# Patient Record
Sex: Male | Born: 1960 | Hispanic: Yes | Marital: Single | Smoking: Former smoker
Health system: Southern US, Community
[De-identification: ages and names within clinical notes are randomized; demographics above are authoritative.]

## PROBLEM LIST (undated history)

## (undated) DIAGNOSIS — I10 Essential (primary) hypertension: Secondary | ICD-10-CM

## (undated) DIAGNOSIS — E119 Type 2 diabetes mellitus without complications: Secondary | ICD-10-CM

---

## 2005-08-13 ENCOUNTER — Emergency Department: Payer: Self-pay | Admitting: Internal Medicine

## 2010-10-09 ENCOUNTER — Emergency Department: Payer: Self-pay | Admitting: Emergency Medicine

## 2012-10-18 ENCOUNTER — Emergency Department: Payer: Self-pay | Admitting: Emergency Medicine

## 2013-09-13 ENCOUNTER — Emergency Department: Payer: Self-pay | Admitting: Internal Medicine

## 2013-09-17 ENCOUNTER — Emergency Department: Payer: Self-pay | Admitting: Emergency Medicine

## 2013-09-27 ENCOUNTER — Emergency Department: Payer: Self-pay | Admitting: Emergency Medicine

## 2014-07-21 ENCOUNTER — Emergency Department
Admission: EM | Admit: 2014-07-21 | Discharge: 2014-07-22 | Disposition: A | Discharge status: Home / Self Care | Payer: Self-pay

## 2016-10-17 ENCOUNTER — Emergency Department
Admission: EM | Admit: 2016-10-17 | Discharge: 2016-10-17 | Disposition: A | Discharge status: Home / Self Care | Payer: No Typology Code available for payment source | Attending: Student in an Organized Health Care Education/Training Program | Admitting: Student in an Organized Health Care Education/Training Program

## 2016-10-17 ENCOUNTER — Emergency Department: Payer: No Typology Code available for payment source

## 2016-10-17 ENCOUNTER — Encounter: Payer: Self-pay | Admitting: Emergency Medicine

## 2016-10-17 DIAGNOSIS — Z87891 Personal history of nicotine dependence: Secondary | ICD-10-CM | POA: Insufficient documentation

## 2016-10-17 DIAGNOSIS — M542 Cervicalgia: Secondary | ICD-10-CM | POA: Diagnosis not present

## 2016-10-17 DIAGNOSIS — M25511 Pain in right shoulder: Secondary | ICD-10-CM | POA: Insufficient documentation

## 2016-10-17 MED ORDER — KETOROLAC TROMETHAMINE 30 MG/ML IJ SOLN
30.0000 mg | Freq: Once | INTRAMUSCULAR | Status: AC
  Administered 2016-10-17: 30 mg via INTRAMUSCULAR
  Filled 2016-10-17: qty 1

## 2016-10-17 MED ORDER — MELOXICAM 7.5 MG PO TABS: 7.5000 mg | ORAL_TABLET | Freq: Every day | ORAL | 1 refills | Status: AC

## 2016-10-17 MED ORDER — CYCLOBENZAPRINE HCL 5 MG PO TABS: 5.0000 mg | ORAL_TABLET | Freq: Three times a day (TID) | ORAL | 0 refills | Status: AC | PRN
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
## 2017-11-01 ENCOUNTER — Emergency Department
Admission: EM | Admit: 2017-11-01 | Discharge: 2017-11-02 | Disposition: A | Discharge status: Home / Self Care | Payer: Self-pay | Attending: Emergency Medicine | Admitting: Emergency Medicine

## 2017-11-01 DIAGNOSIS — M545 Low back pain, unspecified: Secondary | ICD-10-CM

## 2017-11-01 DIAGNOSIS — Z87891 Personal history of nicotine dependence: Secondary | ICD-10-CM | POA: Insufficient documentation

## 2017-11-01 NOTE — ED Triage Notes (Signed)
Patient c/o low back pain beginning Saturday. patient reports they moved this weekend; patient was lifting multiple heavy objects.

## 2017-11-02 MED ORDER — LIDOCAINE 5 % EX PTCH
2.0000 | MEDICATED_PATCH | CUTANEOUS | Status: DC
  Administered 2017-11-02: 2 via TRANSDERMAL
  Filled 2017-11-02: qty 2

## 2017-11-02 MED ORDER — CYCLOBENZAPRINE HCL 10 MG PO TABS
10.0000 mg | ORAL_TABLET | Freq: Once | ORAL | Status: AC
  Administered 2017-11-02: 10 mg via ORAL
  Filled 2017-11-02: qty 1

## 2017-11-02 MED ORDER — CYCLOBENZAPRINE HCL 10 MG PO TABS: 10.0000 mg | ORAL_TABLET | Freq: Three times a day (TID) | ORAL | 0 refills | Status: DC | PRN

## 2017-11-02 NOTE — ED Provider Notes (Signed)
[facility] Emergency Department Provider Note    First MD Initiated Contact with Patient 11/02/17 0025     (approximate)  I have reviewed the triage vital signs and the nursing notes.   HISTORY  Chief Complaint Back Pain    HPI Richard Stein is a 57 y.o. male resents to the emergency department with 5-day history of low back pain that is worse with movement.  Patient states current pain score is 8 out of 10.  Patient denies any urinary or bladder habit changes.  Patient denies any lower extremity weakness numbness or gait instability.   Past medical history None There are no active problems to display for this patient.   Past surgical history None Prior to Admission medications   Not on File    Allergies Penicillins  No family history on file.  Social History Social History   Tobacco Use  . Smoking status: Former Games developer  . Smokeless tobacco: Never Used  Substance Use Topics  . Alcohol use: Yes  . Drug use: Not on file    Review of Systems Constitutional: No fever/chills Eyes: No visual changes. ENT: No sore throat. Cardiovascular: Denies chest pain. Respiratory: Denies shortness of breath. Gastrointestinal: No abdominal pain.  No nausea, no vomiting.  No diarrhea.  No constipation. Genitourinary: Negative for dysuria. Musculoskeletal: Negative for neck pain.  Positive for back pain. Integumentary: Negative for rash. Neurological: Negative for headaches, focal weakness or numbness.   ____________________________________________   PHYSICAL EXAM:  VITAL SIGNS: ED Triage Vitals [11/01/17 2332]  Enc Vitals Group     BP (!) 151/78     Pulse Rate 90     Resp 19     Temp 98.2 F (36.8 C)     Temp Source Oral     SpO2 96 %     Weight 113.4 kg (250 lb)     Height 1.702 m (5\' 7" )     Head Circumference      Peak Flow      Pain Score 9     Pain Loc      Pain Edu?      Excl. in GC?     Constitutional: Alert and  oriented. Well appearing and in no acute distress. Eyes: Conjunctivae are normal. Head: Atraumatic. Mouth/Throat: Mucous membranes are moist.  Oropharynx non-erythematous. Neck: No stridor.   Cardiovascular: Normal rate, regular rhythm. Good peripheral circulation. Grossly normal heart sounds. Respiratory: Normal respiratory effort.  No retractions. Lungs CTAB. Gastrointestinal: Soft and nontender. No distention.  Musculoskeletal: Pain with lumbar paraspinal muscle palpation Neurologic:  Normal speech and language. No gross focal neurologic deficits are appreciated.  Skin:  Skin is warm, dry and intact. No rash noted. Psychiatric: Mood and affect are normal. Speech and behavior are normal.     Procedures   ____________________________________________   INITIAL IMPRESSION / ASSESSMENT AND PLAN / ED COURSE  As part of my medical decision making, I reviewed the following data within the electronic MEDICAL RECORD NUMBER   57 year old male presented with above-stated history and physical exam secondary low back pain without any focal neurological deficit.  Patient given Lidoderm patch and Flexeril as I suspect muscular etiology. ____________________________________________  FINAL CLINICAL IMPRESSION(S) / ED DIAGNOSES  Final diagnoses:  Acute bilateral low back pain without sciatica     MEDICATIONS GIVEN DURING THIS VISIT:  Medications  lidocaine (LIDODERM) 5 % 2 patch (has no administration in time range)  cyclobenzaprine (FLEXERIL) tablet 10 mg (has no administration  in time range)     ED Discharge Orders    None       Note:  This document was prepared using Dragon voice recognition software and may include unintentional dictation errors.    Darci Current, MD 11/02/17 [phone]

## 2019-04-06 ENCOUNTER — Other Ambulatory Visit: Payer: Self-pay

## 2019-04-06 ENCOUNTER — Emergency Department
Admission: EM | Admit: 2019-04-06 | Discharge: 2019-04-06 | Disposition: A | Discharge status: Home / Self Care | Payer: Self-pay | Attending: Student | Admitting: Student

## 2019-04-06 ENCOUNTER — Emergency Department: Payer: Self-pay

## 2019-04-06 DIAGNOSIS — R519 Headache, unspecified: Secondary | ICD-10-CM | POA: Insufficient documentation

## 2019-04-06 DIAGNOSIS — M25461 Effusion, right knee: Secondary | ICD-10-CM | POA: Insufficient documentation

## 2019-04-06 DIAGNOSIS — W109XXA Fall (on) (from) unspecified stairs and steps, initial encounter: Secondary | ICD-10-CM | POA: Insufficient documentation

## 2019-04-06 DIAGNOSIS — Y929 Unspecified place or not applicable: Secondary | ICD-10-CM | POA: Insufficient documentation

## 2019-04-06 DIAGNOSIS — Z87891 Personal history of nicotine dependence: Secondary | ICD-10-CM | POA: Insufficient documentation

## 2019-04-06 DIAGNOSIS — Y939 Activity, unspecified: Secondary | ICD-10-CM | POA: Insufficient documentation

## 2019-04-06 DIAGNOSIS — Y999 Unspecified external cause status: Secondary | ICD-10-CM | POA: Insufficient documentation

## 2019-04-06 DIAGNOSIS — M25561 Pain in right knee: Secondary | ICD-10-CM | POA: Insufficient documentation

## 2019-04-06 DIAGNOSIS — I10 Essential (primary) hypertension: Secondary | ICD-10-CM | POA: Insufficient documentation

## 2019-04-06 HISTORY — DX: Essential (primary) hypertension: I10

## 2019-04-06 NOTE — ED Notes (Signed)
Pt c/o rt knee pain from a fall this afternoon. Pt was going down stairs and missed the last step. Pt states he hit his rt knee and head. Pt states knee hurts worse than head. Pt denies LOC.

## 2019-04-06 NOTE — ED Provider Notes (Signed)
[facility] Emergency Department Provider Note  ____________________________________________   First MD Initiated Contact with Patient 04/06/19 2232     (approximate)  I have reviewed the triage vital signs and the nursing notes.   HISTORY  Chief Complaint Leg Pain and Fall    HPI Richard Stein is a 59 y.o. male presents emergency department complaining of right knee pain.  States he fell down this afternoon.  No other injury reported.  No head injury or LOC.  No neck pain or back pain.  No wrist pain or arm pain    Past Medical History:  Diagnosis Date  . Hypertension     There are no problems to display for this patient.   History reviewed. No pertinent surgical history.  Prior to Admission medications   Medication Sig Start Date End Date Taking? Authorizing Provider  cyclobenzaprine (FLEXERIL) 10 MG tablet Take 1 tablet (10 mg total) by mouth 3 (three) times daily as needed. 11/02/17   Darci Current, MD    Allergies Penicillins  History reviewed. No pertinent family history.  Social History Social History   Tobacco Use  . Smoking status: Former Games developer  . Smokeless tobacco: Never Used  Substance Use Topics  . Alcohol use: Yes  . Drug use: Not Currently    Review of Systems  Constitutional: No fever/chills Eyes: No visual changes. ENT: No sore throat. Respiratory: Denies cough Genitourinary: Negative for dysuria. Musculoskeletal: Negative for back pain.  Positive for right knee pain Skin: Negative for rash. Psychiatric: no mood changes,     ____________________________________________   PHYSICAL EXAM:  VITAL SIGNS: ED Triage Vitals  Enc Vitals Group     BP 04/06/19 2141 (!) 156/73     Pulse Rate 04/06/19 2141 81     Resp 04/06/19 2141 20     Temp 04/06/19 2141 98.2 F (36.8 C)     Temp Source 04/06/19 2141 Oral     SpO2 04/06/19 2141 95 %     Weight 04/06/19 2143 230 lb (104.3 kg)     Height 04/06/19  2143 5\' 7"  (1.702 m)     Head Circumference --      Peak Flow --      Pain Score 04/06/19 2141 8     Pain Loc --      Pain Edu? --      Excl. in GC? --     Constitutional: Alert and oriented. Well appearing and in no acute distress. Eyes: Conjunctivae are normal.  Head: Atraumatic. Nose: No congestion/rhinnorhea. Mouth/Throat: Mucous membranes are moist.   Neck:  supple no lymphadenopathy noted Cardiovascular: Normal rate, regular rhythm.  Respiratory: Normal respiratory effort.  No retractions GU: deferred Musculoskeletal: FROM all extremities, warm and well perfused, right knee is tender to palpation, feels warm to touch, left knee is not tender, neurovascular is intact Neurologic:  Normal speech and language.  Skin:  Skin is warm, dry and intact. No rash noted. Psychiatric: Mood and affect are normal. Speech and behavior are normal.  ____________________________________________   LABS (all labs ordered are listed, but only abnormal results are displayed)  Labs Reviewed - No data to display ____________________________________________   ____________________________________________  RADIOLOGY  X-ray of the right knee is negative for fracture, positive effusion  ____________________________________________   PROCEDURES  Procedure(s) performed: Knee immobilizer   Procedures    ____________________________________________   INITIAL IMPRESSION / ASSESSMENT AND PLAN / ED COURSE  Pertinent labs & imaging results that were available  during my care of the patient were reviewed by me and considered in my medical decision making (see chart for details).   59 year old male presents emergency department with concerns of right knee pain after a fall earlier today.  Physical exam shows patient to appear well.  Right knee is tender to palpation.  X-ray of the right knee is negative for fracture, positive for small joint effusion  Explained the findings to the patient.   Is placed in a knee immobilizer.  Take Tylenol or ibuprofen for pain as needed.  Apply ice.  Return if worsening.  See orthopedics if not better in 1 week.    Richard Stein was evaluated in Emergency Department on 04/06/2019 for the symptoms described in the history of present illness. He was evaluated in the context of the global COVID-19 pandemic, which necessitated consideration that the patient might be at risk for infection with the SARS-CoV-2 virus that causes COVID-19. Institutional protocols and algorithms that pertain to the evaluation of patients at risk for COVID-19 are in a state of rapid change based on information released by regulatory bodies including the CDC and federal and state organizations. These policies and algorithms were followed during the patient's care in the ED.   As part of my medical decision making, I reviewed the following data within the [place] notes reviewed and incorporated, Old chart reviewed, Radiograph reviewed , Notes from prior ED visits and [place] Controlled Substance Database  ____________________________________________   FINAL CLINICAL IMPRESSION(S) / ED DIAGNOSES  Final diagnoses:  Acute pain of right knee  Knee effusion, right      NEW MEDICATIONS STARTED DURING THIS VISIT:  New Prescriptions   No medications on file     Note:  This document was prepared using Dragon voice recognition software and may include unintentional dictation errors.    Versie Starks, PA-C 04/06/19 2311    Lilia Pro., MD 04/07/19 1113

## 2019-04-06 NOTE — ED Triage Notes (Signed)
Pt comes from home where he was carrying siding down stairs and states he missed a step, fell down knees first and rolled down a small hill. Pt states he has pain in the lateral side of right knee. Pt reports that he did hit his head in the grass, A&O x4, no loss of consciousness and no visible deformities noted.

## 2019-04-06 NOTE — Discharge Instructions (Addendum)
Follow up with your regular doctor or dr Joice Lofts if not better in 1 week Apply ice to right knee Tylenol or ibuprofen for pain as needed Return if worsening

## 2021-10-14 ENCOUNTER — Ambulatory Visit (INDEPENDENT_AMBULATORY_CARE_PROVIDER_SITE_OTHER): Payer: Self-pay | Admitting: Nurse Practitioner

## 2021-10-14 ENCOUNTER — Encounter: Payer: Self-pay | Admitting: Nurse Practitioner

## 2021-10-14 VITALS — BP 144/81 | HR 69 | Ht 67.0 in | Wt 237.3 lb

## 2021-10-14 DIAGNOSIS — E785 Hyperlipidemia, unspecified: Secondary | ICD-10-CM

## 2021-10-14 DIAGNOSIS — E119 Type 2 diabetes mellitus without complications: Secondary | ICD-10-CM

## 2021-10-14 DIAGNOSIS — Z6837 Body mass index (BMI) 37.0-37.9, adult: Secondary | ICD-10-CM

## 2021-10-14 DIAGNOSIS — I10 Essential (primary) hypertension: Secondary | ICD-10-CM

## 2021-10-14 LAB — POCT GLYCOSYLATED HEMOGLOBIN (HGB A1C): HbA1c POC (<> result, manual entry): 7.4 % (ref 4.0–5.6)

## 2021-10-14 LAB — GLUCOSE, POCT (MANUAL RESULT ENTRY): POC Glucose: 194 mg/dl — AB (ref 70–99)

## 2021-10-14 MED ORDER — LOSARTAN POTASSIUM 25 MG PO TABS: 25.0000 mg | ORAL_TABLET | Freq: Every day | ORAL | 1 refills | Status: DC

## 2021-10-14 MED ORDER — METFORMIN HCL 500 MG PO TABS: 500.0000 mg | ORAL_TABLET | Freq: Two times a day (BID) | ORAL | 3 refills | Status: AC

## 2021-10-14 NOTE — Progress Notes (Signed)
New Patient Office Visit  Subjective    Patient ID: Richard Stein, male    DOB: 1960-04-15  Age: 61 y.o. MRN: [id number]  CC:  Chief Complaint  Patient presents with   New Patient (Initial Visit)    Patient here today to establish care. Patient has complaints of erectile dis function.     HPI Richard Stein presents to establish care.  Patient was seen at community clinic in [facility].  He has history of hypertension, hyperlipidemia and diabetes.   Outpatient Encounter Medications as of 10/14/2021  Medication Sig   losartan (COZAAR) 25 MG tablet Take 1 tablet (25 mg total) by mouth daily.   metFORMIN (GLUCOPHAGE) 500 MG tablet Take 1 tablet (500 mg total) by mouth 2 (two) times daily with a meal.   [DISCONTINUED] cyclobenzaprine (FLEXERIL) 10 MG tablet Take 1 tablet (10 mg total) by mouth 3 (three) times daily as needed.   No facility-administered encounter medications on file as of 10/14/2021.    Past Medical History:  Diagnosis Date   Hypertension     History reviewed. No pertinent surgical history.  History reviewed. No pertinent family history.  Social History   Socioeconomic History   Marital status: Single    Spouse name: Not on file   Number of children: Not on file   Years of education: Not on file   Highest education level: Not on file  Occupational History   Not on file  Tobacco Use   Smoking status: Former   Smokeless tobacco: Never  Substance and Sexual Activity   Alcohol use: Yes   Drug use: Not Currently   Sexual activity: Not on file  Other Topics Concern   Not on file  Social History Narrative   Not on file   Social Determinants of Health   Financial Resource Strain: Not on file  Food Insecurity: Not on file  Transportation Needs: Not on file  Physical Activity: Not on file  Stress: Not on file  Social Connections: Not on file  Intimate Partner Violence: Not on file    Review of Systems  Constitutional: Negative.   HENT:  Negative.    Eyes: Negative.   Respiratory: Negative.    Cardiovascular: Negative.   Gastrointestinal: Negative.   Genitourinary: Negative.   Musculoskeletal: Negative.   Skin: Negative.   Neurological:  Negative for dizziness, tingling and headaches.  Psychiatric/Behavioral:  Negative for depression, substance abuse and suicidal ideas.         Objective    BP (!) 144/81   Pulse 69   Ht 5\' 7"  (1.702 m)   Wt 237 lb 4.8 oz (107.6 kg)   BMI 37.17 kg/m   Physical Exam      Assessment & Plan:   Problem List Items Addressed This Visit       Cardiovascular and Mediastinum   Primary hypertension    Blood pressure 144/81 in the office today. Advised patient to consume low-salt heart healthy diet. Continue losartan 25 mg daily. Advised patient to check blood pressure at home and bring the reading at next office visit. Will adjust medication if blood pressure remains elevated.      Relevant Medications   losartan (COZAAR) 25 MG tablet   Other Relevant Orders   CBC with Differential/Platelet (Completed)   COMPLETE METABOLIC PANEL WITH GFR (Completed)   Lipid panel (Completed)   TSH (Completed)   PSA (Completed)     Endocrine   Type 2 diabetes mellitus without complication, without long-term  current use of insulin (HCC) - Primary    Hemoglobin A1c 7.4 and blood sugar 194 in the office today. Advised pt to check the BS regularly, make a log and bring to next appointment.  Advised pt to monitor diet. Advised pt to eat variety of food including fruits, vegetables, whole grains, complex carbohydrates and proteins.  Encouraged him to follow regular exercise routine. Continue metformin 500 mg daily.      Relevant Medications   metFORMIN (GLUCOPHAGE) 500 MG tablet   losartan (COZAAR) 25 MG tablet   Other Relevant Orders   POCT glucose (manual entry) (Completed)   POCT HgB A1C (Completed)   Microalbumin, urine (Completed)     Other   Class 2 severe obesity with  serious comorbidity and body mass index (BMI) of 37.0 to 37.9 in adult [facility])    Body mass index is 37.17 kg/m. Advised pt to lose weight. Advised patient to avoid trans fat, fatty and fried food. Follow a regular physical activity schedule. Went over the risk of chronic diseases with increased weight.           Relevant Medications   metFORMIN (GLUCOPHAGE) 500 MG tablet   Hyperlipidemia    Has elevated LDL, triglycerides and total cholesterol on 11/09/2019 results from care everywhere. Lowering the levels of LDL helps reduce the risk of cardiovascular diseases.  LDL can be lowered via lifestyle modification and pharmacological therapy.  Eat healthy diet, perform regular aerobic exercise and weight loss. Labs ordered      Relevant Medications   losartan (COZAAR) 25 MG tablet    No follow-ups on file.   Theresia Lo, NP

## 2021-10-15 LAB — COMPLETE METABOLIC PANEL WITH GFR
AG Ratio: 1.4 (calc) (ref 1.0–2.5)
ALT: 14 U/L (ref 9–46)
AST: 13 U/L (ref 10–35)
Albumin: 4.2 g/dL (ref 3.6–5.1)
Alkaline phosphatase (APISO): 121 U/L (ref 35–144)
BUN/Creatinine Ratio: 17 (calc) (ref 6–22)
BUN: 11 mg/dL (ref 7–25)
CO2: 22 mmol/L (ref 20–32)
Calcium: 9.1 mg/dL (ref 8.6–10.3)
Chloride: 103 mmol/L (ref 98–110)
Creat: 0.66 mg/dL — ABNORMAL LOW (ref 0.70–1.35)
Globulin: 3 g/dL (calc) (ref 1.9–3.7)
Glucose, Bld: 188 mg/dL — ABNORMAL HIGH (ref 65–99)
Potassium: 4.4 mmol/L (ref 3.5–5.3)
Sodium: 139 mmol/L (ref 135–146)
Total Bilirubin: 0.5 mg/dL (ref 0.2–1.2)
Total Protein: 7.2 g/dL (ref 6.1–8.1)
eGFR: 107 mL/min/{1.73_m2} (ref >=60)

## 2021-10-15 LAB — CBC WITH DIFFERENTIAL/PLATELET
Absolute Monocytes: 488 cells/uL (ref 200–950)
Basophils Absolute: 59 cells/uL (ref 0–200)
Basophils Relative: 0.9 %
Eosinophils Absolute: 189 cells/uL (ref 15–500)
Eosinophils Relative: 2.9 %
HCT: 49.8 % (ref 38.5–50.0)
Hemoglobin: 16.9 g/dL (ref 13.2–17.1)
Lymphs Abs: 2126 cells/uL (ref 850–3900)
MCH: 31 pg (ref 27.0–33.0)
MCHC: 33.9 g/dL (ref 32.0–36.0)
MCV: 91.4 fL (ref 80.0–100.0)
MPV: 10.5 fL (ref 7.5–12.5)
Monocytes Relative: 7.5 %
Neutro Abs: 3640 cells/uL (ref 1500–7800)
Neutrophils Relative %: 56 %
Platelets: 260 10*3/uL (ref 140–400)
RBC: 5.45 10*6/uL (ref 4.20–5.80)
RDW: 12.4 % (ref 11.0–15.0)
Total Lymphocyte: 32.7 %
WBC: 6.5 10*3/uL (ref 3.8–10.8)

## 2021-10-15 LAB — LIPID PANEL
Cholesterol: 216 mg/dL — ABNORMAL HIGH (ref <200)
HDL: 49 mg/dL (ref >=40)
LDL Cholesterol (Calc): 138 mg/dL (calc) — ABNORMAL HIGH
Non-HDL Cholesterol (Calc): 167 mg/dL (calc) — ABNORMAL HIGH (ref <130)
Total CHOL/HDL Ratio: 4.4 (calc) (ref <5.0)
Triglycerides: 159 mg/dL — ABNORMAL HIGH (ref <150)

## 2021-10-15 LAB — MICROALBUMIN, URINE: Microalb, Ur: 1.4 mg/dL

## 2021-10-15 LAB — PSA: PSA: 0.66 ng/mL (ref <=4.00)

## 2021-10-15 LAB — TSH: TSH: 1.42 mIU/L (ref 0.40–4.50)

## 2021-10-23 DIAGNOSIS — E785 Hyperlipidemia, unspecified: Secondary | ICD-10-CM | POA: Insufficient documentation

## 2021-10-23 DIAGNOSIS — I10 Essential (primary) hypertension: Secondary | ICD-10-CM | POA: Insufficient documentation

## 2021-10-23 DIAGNOSIS — E119 Type 2 diabetes mellitus without complications: Secondary | ICD-10-CM | POA: Insufficient documentation

## 2021-10-23 DIAGNOSIS — Z6837 Body mass index (BMI) 37.0-37.9, adult: Secondary | ICD-10-CM | POA: Insufficient documentation

## 2021-10-23 NOTE — Assessment & Plan Note (Signed)
Body mass index is 37.17 kg/m. Advised pt to lose weight. Advised patient to avoid trans fat, fatty and fried food. Follow a regular physical activity schedule. Went over the risk of chronic diseases with increased weight.

## 2021-10-23 NOTE — Progress Notes (Signed)
Please call pt with the abnormal result and make a follow up appointment.

## 2021-10-23 NOTE — Assessment & Plan Note (Signed)
Blood pressure 144/81 in the office today. Advised patient to consume low-salt heart healthy diet. Continue losartan 25 mg daily. Advised patient to check blood pressure at home and bring the reading at next office visit. Will adjust medication if blood pressure remains elevated.

## 2021-10-23 NOTE — Assessment & Plan Note (Signed)
Has elevated LDL, triglycerides and total cholesterol on 11/09/2019 results from care everywhere. Lowering the levels of LDL helps reduce the risk of cardiovascular diseases.  LDL can be lowered via lifestyle modification and pharmacological therapy.  Eat healthy diet, perform regular aerobic exercise and weight loss. Labs ordered

## 2021-10-23 NOTE — Assessment & Plan Note (Addendum)
Hemoglobin A1c 7.4 and blood sugar 194 in the office today. Advised pt to check the BS regularly, make a log and bring to next appointment.  Advised pt to monitor diet. Advised pt to eat variety of food including fruits, vegetables, whole grains, complex carbohydrates and proteins.  Encouraged him to follow regular exercise routine. Continue metformin 500 mg daily.

## 2021-10-29 ENCOUNTER — Ambulatory Visit: Payer: Self-pay | Admitting: Nurse Practitioner

## 2021-11-27 IMAGING — DX DG KNEE COMPLETE 4+V*R*
4 series · 4 of 4 positions shown · non-contrast
Comparison: None.

CLINICAL DATA: Fall, injury. Missed a step leading to fall with
right lateral knee pain.

EXAM:
RIGHT KNEE - COMPLETE 4+ VIEW

[knee ap]
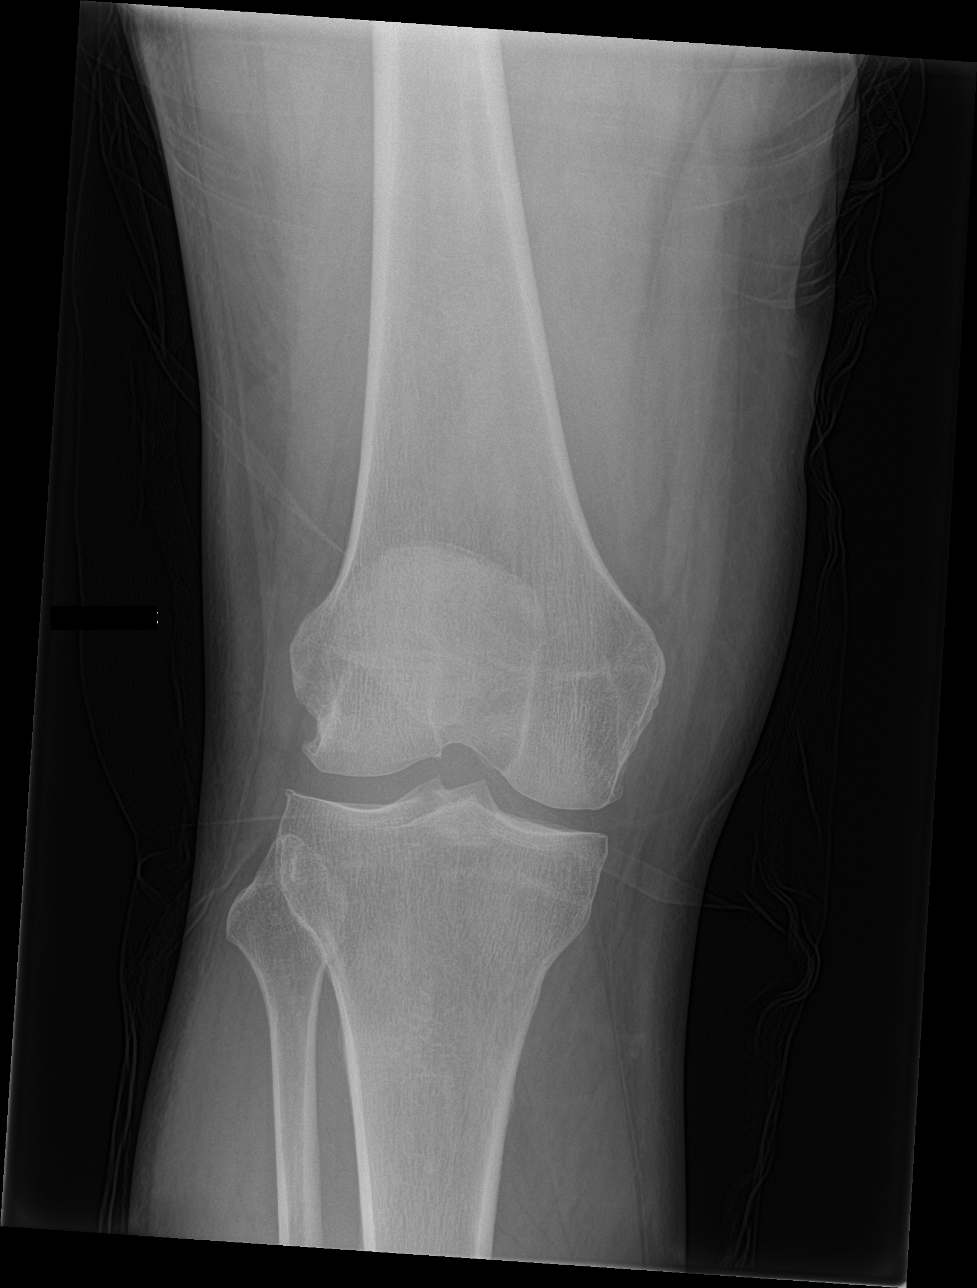

[knee obl (1 of 2)]
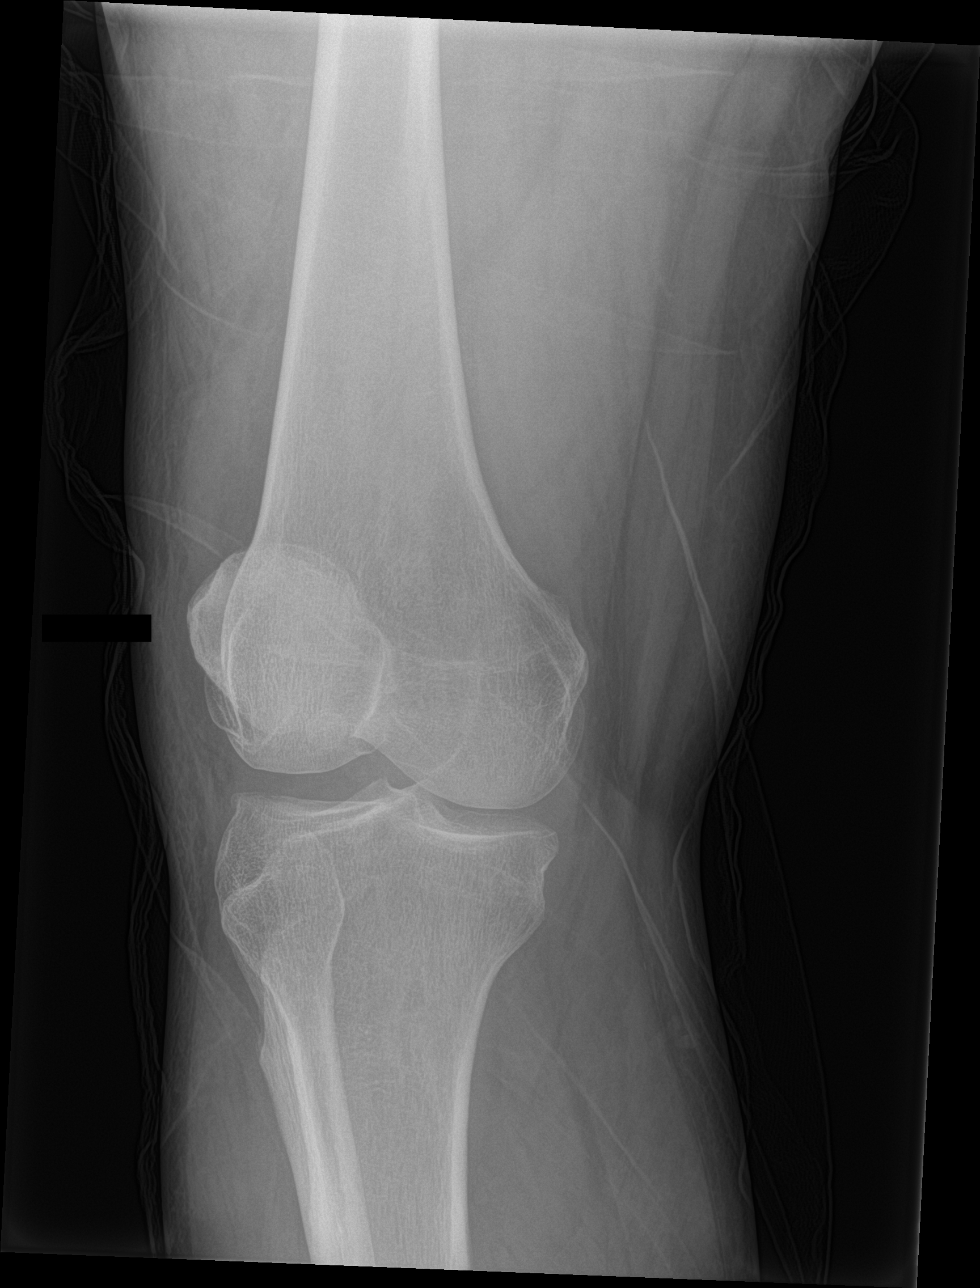

[knee lat]
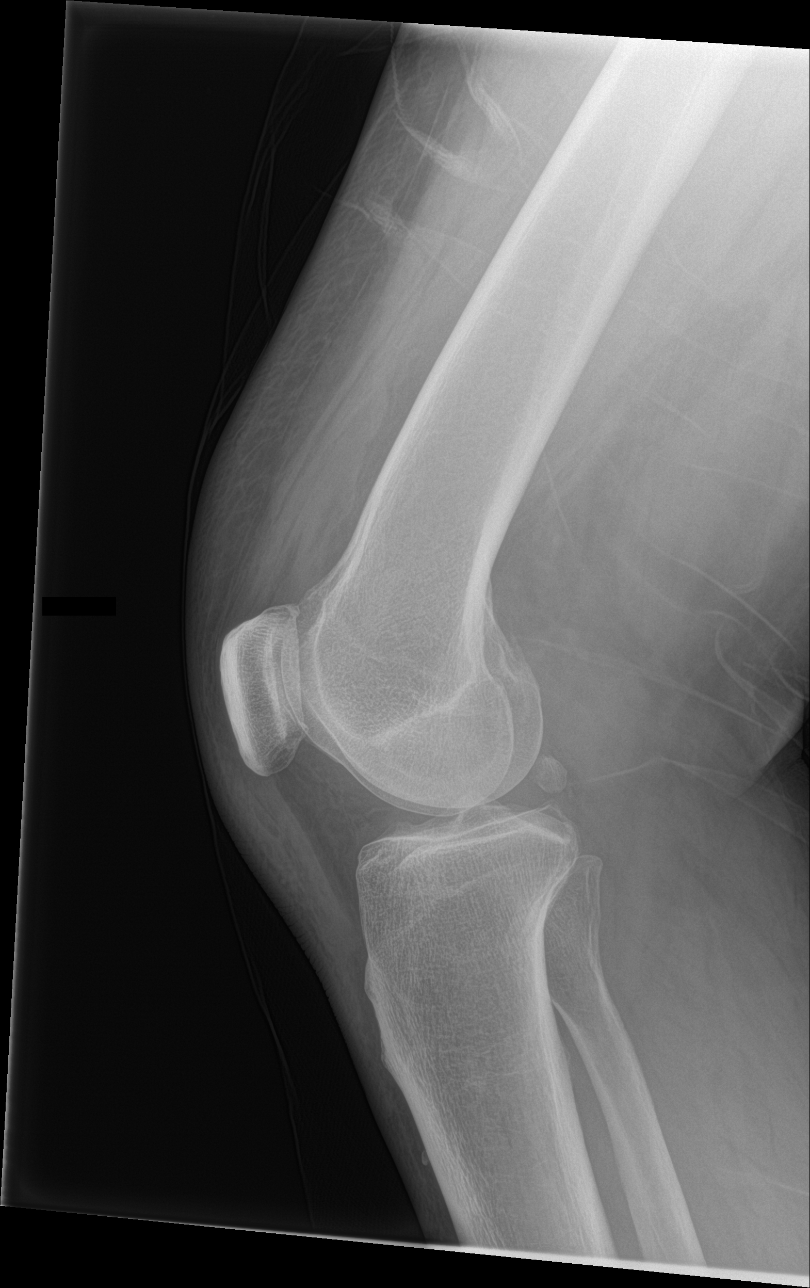

[knee obl (2 of 2)]
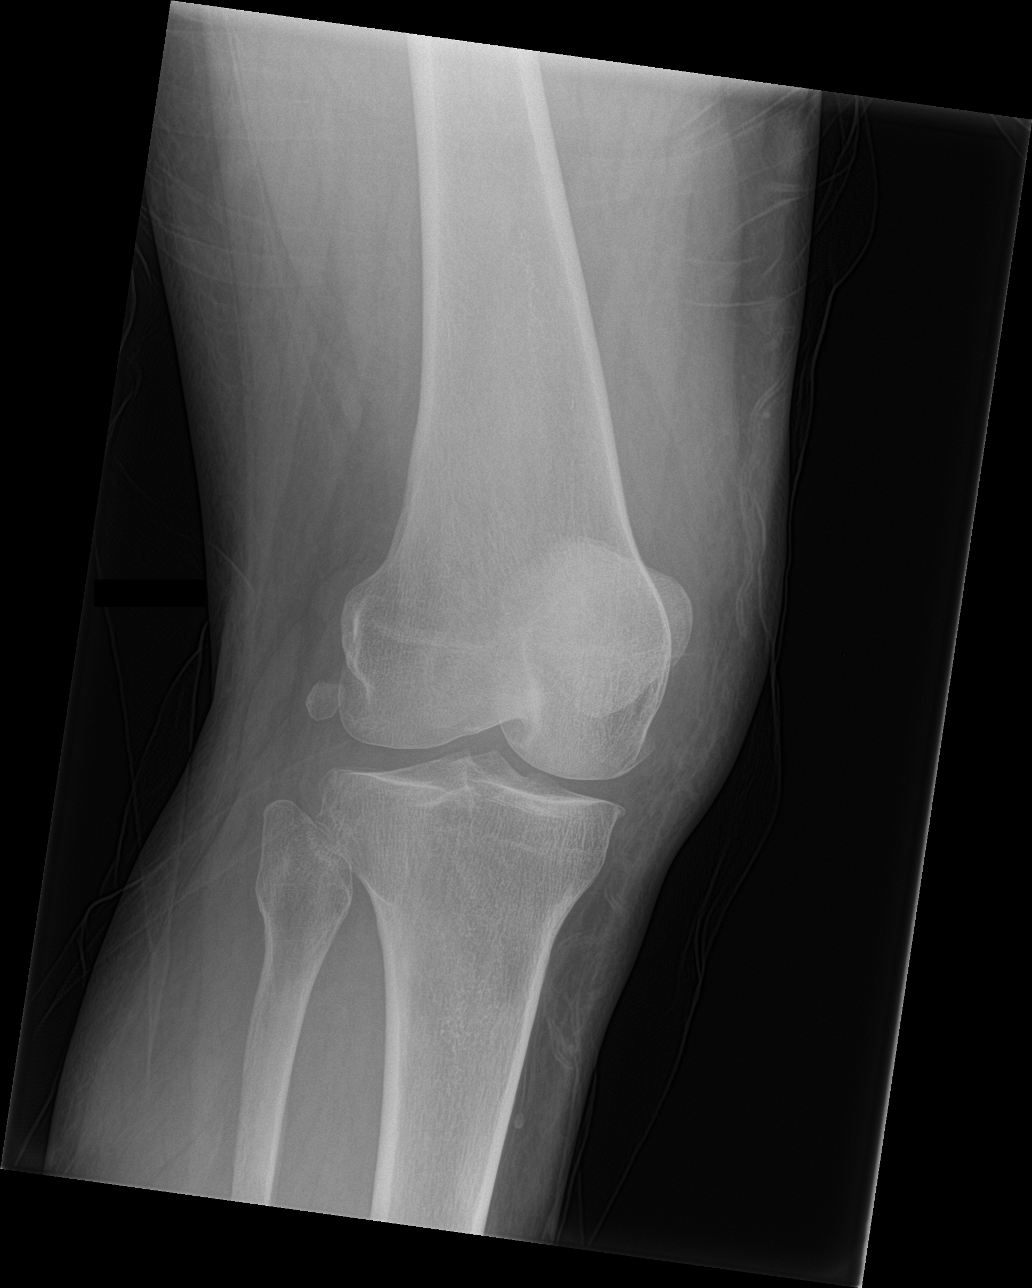

[4 of 4 positions shown; findings below may reference images not displayed]

FINDINGS: No evidence of fracture, dislocation, or joint effusion. Mild
tricompartmental peripheral spurring. The joint spaces are
preserved. Small knee joint effusion. Mild generalized soft tissue
edema.
IMPRESSION: 1. No acute fracture or dislocation.  Mild soft tissue edema.
2. Mild tricompartmental osteoarthritis.  Small knee joint effusion.

## 2022-12-19 ENCOUNTER — Other Ambulatory Visit: Payer: Self-pay | Admitting: Nurse Practitioner

## 2023-03-18 ENCOUNTER — Emergency Department: Payer: Worker's Compensation

## 2023-03-18 ENCOUNTER — Emergency Department
Admission: EM | Admit: 2023-03-18 | Discharge: 2023-03-18 | Disposition: A | Discharge status: Home / Self Care | Payer: Worker's Compensation | Attending: Emergency Medicine | Admitting: Emergency Medicine

## 2023-03-18 ENCOUNTER — Emergency Department: Payer: Self-pay

## 2023-03-18 ENCOUNTER — Other Ambulatory Visit: Payer: Self-pay

## 2023-03-18 DIAGNOSIS — S52592A Other fractures of lower end of left radius, initial encounter for closed fracture: Secondary | ICD-10-CM

## 2023-03-18 DIAGNOSIS — Z23 Encounter for immunization: Secondary | ICD-10-CM | POA: Diagnosis not present

## 2023-03-18 DIAGNOSIS — W11XXXA Fall on and from ladder, initial encounter: Secondary | ICD-10-CM | POA: Diagnosis not present

## 2023-03-18 DIAGNOSIS — S52552A Other extraarticular fracture of lower end of left radius, initial encounter for closed fracture: Secondary | ICD-10-CM | POA: Insufficient documentation

## 2023-03-18 DIAGNOSIS — S32010A Wedge compression fracture of first lumbar vertebra, initial encounter for closed fracture: Secondary | ICD-10-CM | POA: Diagnosis not present

## 2023-03-18 DIAGNOSIS — R0789 Other chest pain: Secondary | ICD-10-CM | POA: Insufficient documentation

## 2023-03-18 DIAGNOSIS — R519 Headache, unspecified: Secondary | ICD-10-CM | POA: Insufficient documentation

## 2023-03-18 DIAGNOSIS — S40922A Unspecified superficial injury of left upper arm, initial encounter: Secondary | ICD-10-CM | POA: Diagnosis present

## 2023-03-18 HISTORY — DX: Type 2 diabetes mellitus without complications: E11.9

## 2023-03-18 LAB — COMPREHENSIVE METABOLIC PANEL
ALT: 35 U/L (ref 0–44)
AST: 49 U/L — ABNORMAL HIGH (ref 15–41)
Albumin: 3.6 g/dL (ref 3.5–5.0)
Alkaline Phosphatase: 96 U/L (ref 38–126)
Anion gap: 10 (ref 5–15)
BUN: 14 mg/dL (ref 8–23)
CO2: 24 mmol/L (ref 22–32)
Calcium: 8.3 mg/dL — ABNORMAL LOW (ref 8.9–10.3)
Chloride: 100 mmol/L (ref 98–111)
Creatinine, Ser: 0.76 mg/dL (ref 0.61–1.24)
GFR, Estimated: >60 mL/min (ref >60)
Glucose, Bld: 247 mg/dL — ABNORMAL HIGH (ref 70–99)
Potassium: 4.3 mmol/L (ref 3.5–5.1)
Sodium: 134 mmol/L — ABNORMAL LOW (ref 135–145)
Total Bilirubin: 0.5 mg/dL (ref 0.0–1.2)
Total Protein: 7 g/dL (ref 6.5–8.1)

## 2023-03-18 LAB — CBC WITH DIFFERENTIAL/PLATELET
Abs Immature Granulocytes: 0.03 10*3/uL (ref 0.00–0.07)
Basophils Absolute: 0 10*3/uL (ref 0.0–0.1)
Basophils Relative: 1 %
Eosinophils Absolute: 0 10*3/uL (ref 0.0–0.5)
Eosinophils Relative: 0 %
HCT: 47.8 % (ref 39.0–52.0)
Hemoglobin: 16.3 g/dL (ref 13.0–17.0)
Immature Granulocytes: 1 %
Lymphocytes Relative: 15 %
Lymphs Abs: 0.9 10*3/uL (ref 0.7–4.0)
MCH: 30.1 pg (ref 26.0–34.0)
MCHC: 34.1 g/dL (ref 30.0–36.0)
MCV: 88.4 fL (ref 80.0–100.0)
Monocytes Absolute: 0.7 10*3/uL (ref 0.1–1.0)
Monocytes Relative: 11 %
Neutro Abs: 4.7 10*3/uL (ref 1.7–7.7)
Neutrophils Relative %: 72 %
Platelets: 184 10*3/uL (ref 150–400)
RBC: 5.41 MIL/uL (ref 4.22–5.81)
RDW: 12.1 % (ref 11.5–15.5)
WBC: 6.4 10*3/uL (ref 4.0–10.5)
nRBC: 0 % (ref 0.0–0.2)

## 2023-03-18 LAB — URINALYSIS, ROUTINE W REFLEX MICROSCOPIC
Bilirubin Urine: NEGATIVE
Glucose, UA: >=500 mg/dL — AB
Hgb urine dipstick: NEGATIVE
Ketones, ur: NEGATIVE mg/dL
Leukocytes,Ua: NEGATIVE
Nitrite: NEGATIVE
Protein, ur: NEGATIVE mg/dL
Specific Gravity, Urine: >1.046 — ABNORMAL HIGH (ref 1.005–1.030)
pH: 5 (ref 5.0–8.0)

## 2023-03-18 LAB — LIPASE, BLOOD: Lipase: 33 U/L (ref 11–51)

## 2023-03-18 LAB — TROPONIN I (HIGH SENSITIVITY): Troponin I (High Sensitivity): 5 ng/L (ref <18)

## 2023-03-18 MED ORDER — ONDANSETRON HCL 4 MG/2ML IJ SOLN
4.0000 mg | Freq: Once | INTRAMUSCULAR | Status: AC
  Administered 2023-03-18: 4 mg via INTRAVENOUS
  Filled 2023-03-18: qty 2

## 2023-03-18 MED ORDER — HYDROMORPHONE HCL 1 MG/ML IJ SOLN
0.5000 mg | Freq: Once | INTRAMUSCULAR | Status: AC
  Administered 2023-03-18: 0.5 mg via INTRAVENOUS
  Filled 2023-03-18: qty 0.5

## 2023-03-18 MED ORDER — LIDOCAINE HCL (PF) 1 % IJ SOLN
5.0000 mL | Freq: Once | INTRAMUSCULAR | Status: AC
  Administered 2023-03-18: 5 mL
  Filled 2023-03-18: qty 5

## 2023-03-18 MED ORDER — OXYCODONE HCL 5 MG PO TABS: 5.0000 mg | ORAL_TABLET | Freq: Four times a day (QID) | ORAL | 0 refills | Status: AC | PRN

## 2023-03-18 MED ORDER — IOHEXOL 300 MG/ML  SOLN
100.0000 mL | Freq: Once | INTRAMUSCULAR | Status: AC | PRN
  Administered 2023-03-18: 100 mL via INTRAVENOUS

## 2023-03-18 MED ORDER — TETANUS-DIPHTH-ACELL PERTUSSIS 5-2.5-18.5 LF-MCG/0.5 IM SUSY
0.5000 mL | PREFILLED_SYRINGE | Freq: Once | INTRAMUSCULAR | Status: AC
  Administered 2023-03-18: 0.5 mL via INTRAMUSCULAR
  Filled 2023-03-18: qty 0.5

## 2023-03-18 MED ORDER — BUPIVACAINE HCL (PF) 0.5 % IJ SOLN
10.0000 mL | Freq: Once | INTRAMUSCULAR | Status: AC
  Administered 2023-03-18: 10 mL
  Filled 2023-03-18: qty 10

## 2023-03-18 MED ORDER — HYDROMORPHONE HCL 1 MG/ML IJ SOLN: 0.5000 mg | Freq: Once | INTRAMUSCULAR | Status: DC

## 2023-03-18 MED ORDER — OXYCODONE HCL 5 MG PO TABS
5.0000 mg | ORAL_TABLET | Freq: Once | ORAL | Status: AC
  Administered 2023-03-18: 5 mg via ORAL
  Filled 2023-03-18: qty 1

## 2023-03-18 NOTE — ED Provider Notes (Addendum)
 [facility] Provider Note    Event Date/Time   First MD Initiated Contact with Patient 03/18/23 1159     (approximate)   History   Fall   HPI  Richard Stein is a 63 y.o. male with history of diabetes who comes in with concerns for fall.  Patient reportedly fell 8 feet to 10 feet off of a ladder.  He states that he was nearly to the top of the ladder when the bottom of it slipped out from underneath and he fell directly backwards.  He states he does not think he hit his head but does report severe back pain some right abdominal pain and some right chest wall pain  Physical Exam   Triage Vital Signs: ED Triage Vitals  Encounter Vitals Group     BP 03/18/23 1150 126/74     Systolic BP Percentile --      Diastolic BP Percentile --      Pulse Rate 03/18/23 1150 90     Resp 03/18/23 1150 20     Temp 03/18/23 1150 98.1 F (36.7 C)     Temp Source 03/18/23 1150 Oral     SpO2 03/18/23 1150 92 %     Weight --      Height --      Head Circumference --      Peak Flow --      Pain Score 03/18/23 1149 9     Pain Loc --      Pain Education --      Exclude from Growth Chart --     Most recent vital signs: Vitals:   03/18/23 1150  BP: 126/74  Pulse: 90  Resp: 20  Temp: 98.1 F (36.7 C)  SpO2: 92%     General: Awake, no distress.  CV:  Good peripheral perfusion.  Resp:  Normal effort.  Abd:  No distention.  Other:  Tenderness on the right chest wall.  There is also some tenderness and slight bruising noted on the right lower abdomen.  He is able to lift both legs up off the bed but reports lower back pain.  He is got tenderness over the left wrist with good distal pulse and able to move his fingers.  There is some small abrasions scattered on him noted.   ED Results / Procedures / Treatments   Labs (all labs ordered are listed, but only abnormal results are displayed) Labs Reviewed  CBC WITH DIFFERENTIAL/PLATELET  COMPREHENSIVE METABOLIC  PANEL  LIPASE, BLOOD  URINALYSIS, ROUTINE W REFLEX MICROSCOPIC  TROPONIN I (HIGH SENSITIVITY)     EKG  My interpretation of EKG:  Normal sinus rate of 93 without any ST elevation, T wave inversion in lead III and V3, right bundle branch block  RADIOLOGY I have reviewed the x-ray personally and interpreted and patient has distal radial fracture  PROCEDURES:  Critical Care performed: No  Procedures   MEDICATIONS ORDERED IN ED: Medications  HYDROmorphone (DILAUDID) injection 0.5 mg (has no administration in time range)  ondansetron (ZOFRAN) injection 4 mg (has no administration in time range)  Tdap (BOOSTRIX) injection 0.5 mL (has no administration in time range)     IMPRESSION / MDM / ASSESSMENT AND PLAN / ED COURSE  I reviewed the triage vital signs and the nursing notes.   Patient's presentation is most consistent with acute presentation with potential threat to life or bodily function.   Given the mechanism of injury CT pan scan was ordered  to evaluate for intrathoracic, intra-abdominal and spinal pathology.  I also ordered an x-ray to evaluate for left wrist fracture.  Patient given IV Dilaudid IV Zofran to help with symptoms Tdap updated  X-ray confirms fracture of the right wrist.  CT head negative CT cervical negative CT thoracic negative Burst L1 vertebral body with 4 mm of retropulsion into the posterior cortex Acute fracture through the right L1 transverse process  Discussed with Dr Katrinka Blazing from neurosurg- recommend MRI  They stated that patient could trial TLSO brace, pain control and outpatient follow-up with neurosurgery or if his pain is too severe could consider admitting for potential surgery.  Patient preferred to try to avoid surgery and is ambulatory in the TLSO brace.  For his left wrist fracture this happened over 24 hours ago so I did discuss with Dr. Hyacinth Meeker to see if I should attempt reduction.  Per his recommendation I did do a hematoma block,  fingertrap for 20 minutes and attempted to help straighten out the fracture.  Slgiht improvement on repeat xray-patient remains neurovascularly intact  Given it would unlikely change management need repeat x-ray patient is requesting to leave prior to results.  Regardless he is to follow-up with orthopedics and understands that he may need to have surgery given difficulties in full reduction of it.    Patient is up and ambulatory and we had discussed if he could not ambulate or pain was not controlled that we could admit him to the hospital for neurosurgery potentially doing surgery versus trying conservative treatment with brace and pain medication.  At this time patient would prefer to trial conservative treatment at home.  He understands need to follow-up with neurosurgery, orthopedics    FINAL CLINICAL IMPRESSION(S) / ED DIAGNOSES   Final diagnoses:  Other closed fracture of distal end of left radius, initial encounter  Closed compression fracture of body of L1 vertebra (HCC)     Rx / DC Orders   ED Discharge Orders          Ordered    oxyCODONE (ROXICODONE) 5 MG immediate release tablet  Every 6 hours PRN        03/18/23 1910             Note:  This document was prepared using Dragon voice recognition software and may include unintentional dictation errors.   Concha Se, MD 03/18/23 Darnell Level    Concha Se, MD 03/18/23 [phone]

## 2023-03-18 NOTE — ED Notes (Signed)
 Patient denies need for drug test from work.

## 2023-03-18 NOTE — Progress Notes (Signed)
 Orthopedic Tech Progress Note Patient Details:  Richard Stein May 25, 1960 [id number] TLSO brace has been ordered from [facility]  Patient ID: [name], male   DOB: 04-Mar-1960, 62 y.o.   MRN: [id number]  [name] 03/18/2023, 4:05 PM

## 2023-03-18 NOTE — Discharge Instructions (Addendum)
 Return to the ER if you have worsening pain, numbness, weakness, difficulties urinating you would need to return to the ER immediately for repeat evaluation.  Otherwise you can take the brace off when you shower and at night when sleeping but otherwise should leave it on.  You should call the neurosurgery number to make a follow-up appointment in 2 weeks  Please make a appointment with orthopedic doctor as soon as possible preferably next week.  You can take Tylenol 1 g every 8 hours, ibuprofen 600 every 8 hours with food to help with pain.  Take oxycodone for breakthrough pain.  Do not drive or work while on this  IMPRESSION: Acute dorsally displaced and angulated intra-articular distal radial fracture.    IMPRESSION: Redemonstrated acute burst type compression deformity at the L1 level with 4 mm retropulsion and approximately 50-60% height loss. Mild spinal canal narrowing at this level secondary to retropulsion of the posterior cortex of L1.  Take oxycodone as prescribed. Do not drink alcohol, drive or participate in any other potentially dangerous activities while taking this medication as it may make you sleepy. Do not take this medication with any other sedating medications, either prescription or over-the-counter. If you were prescribed Percocet or Vicodin, do not take these with acetaminophen (Tylenol) as it is already contained within these medications.  This medication is an opiate (or narcotic) pain medication and can be habit forming. Use it as little as possible to achieve adequate pain control. Do not use or use it with extreme caution if you have a history of opiate abuse or dependence. If you are on a pain contract with your primary care doctor or a pain specialist, be sure to let them know you were prescribed this medication today from the Emergency Department. This medication is intended for your use only - do not give any to anyone else and keep it in a secure place where  nobody else, especially children, have access to it.

## 2023-03-18 NOTE — ED Notes (Signed)
 TLSO Brace requested from [facility] verbal at [phone].

## 2023-03-18 NOTE — ED Notes (Signed)
 Pt to MR

## 2023-03-18 NOTE — ED Triage Notes (Signed)
 Pt to ED POV for fall 8' off ladder yesterday while at work. Pt fell onto back but caught self with L arm while falling. Complains of L arm pain and lower back pain.

## 2023-03-28 ENCOUNTER — Other Ambulatory Visit: Payer: Self-pay

## 2023-03-28 DIAGNOSIS — Z8781 Personal history of (healed) traumatic fracture: Secondary | ICD-10-CM

## 2023-03-28 NOTE — Progress Notes (Unsigned)
 Referring Physician:  No referring provider defined for this encounter.  Primary Physician:  Richard Stein, Richard Stein  History of Present Illness: 03/29/2023 [name] has a history of HNT, DM, obesity, and hyperlipidemia.   Seen in ED on 03/18/23 for L1 burst fracture after a fall off a ladder. He also has left distal radius fracture.   He was given a brace and is here for follow up.   He has constant mid to lower LBP with intermittent right lateral leg pain to his knee. He has intermittent numbness/tingling in right leg with prolonged sitting. Richard left leg pain. Pain is worse with walking and better with sitting. He has improvement with pain medication. He feels like his back pain is slowly improving.   He has splint on left arm. Does not have follow up with ortho.   He does not smoke.   Bowel/Bladder Dysfunction: none  The symptoms are causing a significant impact on the Richard Stein's life.   Review of Systems:  A 10 point review of systems is negative, except for the pertinent positives and negatives detailed in the HPI.  Past Medical History: Past Medical History:  Diagnosis Date   Diabetes mellitus without complication (HCC)    Hypertension     Past Surgical History: Richard past surgical history on file.  Allergies: Allergies as of 03/29/2023 - Review Complete 03/29/2023  Allergen Reaction Noted   Penicillins Rash 10/17/2016    Medications: Outpatient Encounter Medications as of 03/29/2023  Medication Sig   losartan (COZAAR) 25 MG tablet Take 1 tablet (25 mg total) by mouth daily.   metFORMIN (GLUCOPHAGE) 500 MG tablet Take 1 tablet (500 mg total) by mouth 2 (two) times daily with a meal.   Richard facility-administered encounter medications on file as of 03/29/2023.    Social History: Social History   Tobacco Use   Smoking status: Former   Smokeless tobacco: Never  Substance Use Topics   Alcohol use: Yes   Drug use: Not Currently    Family Medical  History: Richard family history on file.  Physical Examination: There were Richard vitals filed for this visit.  General: Richard Stein is well developed, well nourished, calm, collected, and in Richard apparent distress. Attention to examination is appropriate.  Respiratory: Richard Stein is breathing without any difficulty.   NEUROLOGICAL:     Awake, alert, oriented to person, place, and time.  Speech is clear and fluent. Fund of knowledge is appropriate.   Cranial Nerves: Pupils equal round and reactive to light.  Facial tone is symmetric.    He has mild tenderness over region of L1.   He is wearing his TLSO brace.   Richard abnormal lesions on exposed skin.   Strength: Side Biceps Triceps Deltoid Interossei Grip Wrist Ext. Wrist Flex.  R 5 5 5 5 5 5 5   L --- --- --- --- --- --- 5   Side Iliopsoas Quads Hamstring PF DF EHL  R 5 5 5 5 5 5   L 5 5 5 5 5 5    Unable to test strength in left upper extremity due to splint.   Reflexes are 2+ and symmetric at the biceps on right, brachioradialis on right, patella and achilles.   Hoffman's is absent on right, not tested on left.  Clonus is not present.   Bilateral upper and lower extremity sensation is intact to light touch, testing limited in left upper extremity due to splint.   Gait is slow.      Medical  Decision Making  Imaging: Lumbar xrays dated 03/29/23 (done in brace):  Slight increase in compression of L1 fracture compared to previous CT.   Report not available for above xrays. Xrays reviewed with Dr. Katrinka Blazing.    MRI lumbar spine dated 03/18/23:  FINDINGS: Segmentation:  Standard.   Alignment:  Physiologic.   Vertebrae: Redemonstrated acute burst type compression deformity of the L1 level with 4 mm retropulsion approximately 50-60% height loss.   Conus medullaris and cauda equina: Conus extends to the L2 level. Conus and cauda equina appear normal.   Paraspinal and other soft tissues: Negative.   Disc levels:   T12-L1: Mild spinal  canal narrowing secondary to retropulsion of the posterior cortex of L1. Richard evidence of high-grade neural foraminal stenosis. Richard significant disc bulge.   L1-L2: Unremarkable   L2-L3: Mild bilateral facet degenerative change. Minimal disc bulge. Short pedicles. Richard significant spinal canal narrowing. Richard neural foraminal narrowing.   L3-L4: Mild bilateral facet degenerative change. Short pedicles. Richard significant disc bulge. Richard spinal canal narrowing. Richard neural foraminal stenosis.   L4-L5: Mild-to-moderate bilateral facet degenerative change. Short pedicles. Minimal disc bulge. Mild spinal canal narrowing. Richard neural foraminal narrowing.   L5-S1: Mild bilateral facet degenerative change. Richard spinal canal or neural foraminal narrowing.   IMPRESSION: Redemonstrated acute burst type compression deformity at the L1 level with 4 mm retropulsion and approximately 50-60% height loss. Mild spinal canal narrowing at this level secondary to retropulsion of the posterior cortex of L1.     Electronically Signed   By: Lorenza Cambridge M.D.   On: 03/18/2023 15:34    CT lumbar spine dated 03/18/23:  CT LUMBAR SPINE FINDINGS   Segmentation: 5 lumbar type vertebrae.   Alignment: Normal.   Vertebrae: There is an acute burst type compression deformity of the L1 vertebral body level with 4 mm retropulsion of the posterior cortex. This results in mild-to-moderate spinal canal narrowing. There is also an acute fracture through the right L1 transverse process (series 8, image 34).   Paraspinal and other soft tissues: See separately dictated CT chest abdomen and pelvis.   Disc levels: Mild-to-moderate spinal canal narrowing at the L1 level secondary to retropulsion of the posterior cortex of L1.   IMPRESSION: Acute burst type compression deformity at the L1 vertebral body level with 4 mm retropulsion of the posterior cortex. This results in mild-to-moderate spinal canal narrowing. 2.   Acute  fracture through the right L1 transverse process.     Electronically Signed   By: Lorenza Cambridge M.D.   On: 03/18/2023 13:42    I have personally reviewed the images and agree with the above interpretation.  Assessment and Plan: [name] had a fall off a ladder on 03/18/23 and was found to have L1 burst fracture.   He has constant mid to lower LBP with intermittent right lateral leg pain to his knee. He has intermittent numbness/tingling in right leg with prolonged sitting. Richard left leg pain. He feels like his back pain is slowly improving.   Xrays from today show slightly increased compression of L1 fracture in comparison to previous CT scan.   Treatment options discussed with Dr. Katrinka Blazing and following plan made with Richard Stein:   - Continue with TLSO brace when up and walking. Do not wear to sleep. Richard bending, twisting, or lifting.  - Continue on prn oxycodone for pain. PMP reviewed and is appropriate. Refill given.  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  - Call ortho to  follow up for left wrist fracture. Given contact information.  - Follow up with me in 4 weeks with repeat xrays. Discussed total time in brace would likely be 3 months.   I spent a total of 40 minutes in face-to-face and non-face-to-face activities related to this Richard Stein's care today including review of outside records, review of imaging, review of symptoms, physical exam, discussion of differential diagnosis, discussion of treatment options, and documentation.   Thank you for involving me in the care of this Richard Stein.   Drake Leach PA-C Dept. of Neurosurgery

## 2023-03-28 NOTE — Addendum Note (Signed)
 Addended byDrake Leach on: 03/28/2023 10:33 AM   Modules accepted: Orders

## 2023-03-28 NOTE — Addendum Note (Signed)
 Addended byDrake Leach on: 03/28/2023 10:38 AM   Modules accepted: Orders

## 2023-03-29 ENCOUNTER — Ambulatory Visit
Admission: RE | Admit: 2023-03-29 | Discharge: 2023-03-29 | Disposition: A | Discharge status: Home / Self Care | Payer: Self-pay | Source: Ambulatory Visit | Attending: Orthopedic Surgery | Admitting: Orthopedic Surgery

## 2023-03-29 ENCOUNTER — Ambulatory Visit
Admission: RE | Admit: 2023-03-29 | Discharge: 2023-03-29 | Disposition: A | Discharge status: Home / Self Care | Payer: Self-pay | Attending: Orthopedic Surgery | Admitting: Orthopedic Surgery

## 2023-03-29 ENCOUNTER — Encounter: Payer: Self-pay | Admitting: Orthopedic Surgery

## 2023-03-29 ENCOUNTER — Ambulatory Visit (INDEPENDENT_AMBULATORY_CARE_PROVIDER_SITE_OTHER): Payer: Worker's Compensation | Admitting: Orthopedic Surgery

## 2023-03-29 VITALS — BP 130/82 | Ht 66.0 in | Wt 237.0 lb

## 2023-03-29 DIAGNOSIS — S32011A Stable burst fracture of first lumbar vertebra, initial encounter for closed fracture: Secondary | ICD-10-CM | POA: Diagnosis not present

## 2023-03-29 DIAGNOSIS — Z8781 Personal history of (healed) traumatic fracture: Secondary | ICD-10-CM | POA: Insufficient documentation

## 2023-03-29 DIAGNOSIS — W11XXXA Fall on and from ladder, initial encounter: Secondary | ICD-10-CM

## 2023-03-29 DIAGNOSIS — S32001A Stable burst fracture of unspecified lumbar vertebra, initial encounter for closed fracture: Secondary | ICD-10-CM

## 2023-03-29 MED ORDER — OXYCODONE HCL 5 MG PO TABS: 5.0000 mg | ORAL_TABLET | Freq: Four times a day (QID) | ORAL | 0 refills | Status: DC | PRN

## 2023-03-29 NOTE — Patient Instructions (Signed)
 It was so nice to see you today. Thank you so much for coming in.    You have a broken bone in your back at L1. This should heal over the next 3 months.   Wear your brace when up and walking. Do not wear to sleep. No bending, twisting, or lifting.   Continue on oxycodone as needed for severe pain. Take only as needed and be careful, this can make you sleepy and/or constipated. I sent a refill to your pharmacy.   You need to call ortho- Dr. Deeann Saint for follow up of your left wrist.  Contact: [address] [place] [address] [phone]  I will see you back in 4 weeks for repeat xrays.   Please do not hesitate to call if you have any questions or concerns. You can also message me in MyChart.   Drake Leach PA-C [phone]     The physicians and staff at [facility] at [facility] are committed to providing excellent care. You may receive a survey asking for feedback about your experience at our office. We value you your feedback and appreciate you taking the time to to fill it out. The [facility] leadership team is also available to discuss your experience in person, feel free to contact us [phone].

## 2023-04-05 ENCOUNTER — Ambulatory Visit: Payer: Self-pay | Admitting: Physician Assistant

## 2023-04-24 NOTE — Progress Notes (Deleted)
 Referring Physician:  No referring provider defined for this encounter.  Primary Physician:  Patient, No Pcp Per  History of Present Illness: [name] has a history of HNT, DM, obesity, and hyperlipidemia.   Last seen by me on 03/29/23 for L1 burst fracture s/p fall off of a ladder on 03/18/23.   He was having mid to lower LBP with intermittent right lateral leg pain to his knee. He had intermittent numbness/tingling in right leg with prolonged sitting.   He was to stay in TLSO brace and continue prn oxycodone . He was to follow up with ortho for left wrist fracture.   He is here for follow up and repeat xrays.          Seen in ED on 03/18/23 for L1 burst fracture after a fall off a ladder. He also has left distal radius fracture.   He was given a brace and is here for follow up.   He has constant mid to lower LBP with intermittent right lateral leg pain to his knee. He has intermittent numbness/tingling in right leg with prolonged sitting. No left leg pain. Pain is worse with walking and better with sitting. He has improvement with pain medication. He feels like his back pain is slowly improving.   He has splint on left arm. Does not have follow up with ortho.   He does not smoke.   Bowel/Bladder Dysfunction: none  The symptoms are causing a significant impact on the patient's life.   Review of Systems:  A 10 point review of systems is negative, except for the pertinent positives and negatives detailed in the HPI.  Past Medical History: Past Medical History:  Diagnosis Date   Diabetes mellitus without complication (HCC)    Hypertension     Past Surgical History: No past surgical history on file.  Allergies: Allergies as of 04/26/2023 - Review Complete 03/29/2023  Allergen Reaction Noted   Penicillins Rash 10/17/2016    Medications: Outpatient Encounter Medications as of 04/26/2023  Medication Sig   metFORMIN  (GLUCOPHAGE ) 500 MG tablet Take 1 tablet  (500 mg total) by mouth 2 (two) times daily with a meal.   oxyCODONE  (ROXICODONE ) 5 MG immediate release tablet Take 1 tablet (5 mg total) by mouth every 6 (six) hours as needed.   No facility-administered encounter medications on file as of 04/26/2023.    Social History: Social History   Tobacco Use   Smoking status: Former   Smokeless tobacco: Never   Tobacco comments:    QUIT AROUND 2020  Substance Use Topics   Alcohol use: Yes   Drug use: Not Currently    Family Medical History: No family history on file.  Physical Examination: There were no vitals filed for this visit.    Awake, alert, oriented to person, place, and time.  Speech is clear and fluent. Fund of knowledge is appropriate.   Cranial Nerves: Pupils equal round and reactive to light.  Facial tone is symmetric.    He has mild tenderness over region of L1.   He is wearing his TLSO brace.   No abnormal lesions on exposed skin.   Strength: Side Biceps Triceps Deltoid Interossei Grip Wrist Ext. Wrist Flex.  R 5 5 5 5 5 5 5   L --- --- --- --- --- --- 5   Side Iliopsoas Quads Hamstring PF DF EHL  R 5 5 5 5 5 5   L 5 5 5 5 5 5    Unable to test strength  in left upper extremity due to splint.   Reflexes are 2+ and symmetric at the biceps on right, brachioradialis on right, patella and achilles.   Hoffman's is absent on right, not tested on left.  Clonus is not present.   Bilateral upper and lower extremity sensation is intact to light touch, testing limited in left upper extremity due to splint.   Gait is slow.      Medical Decision Making  Imaging: Lumbar xrays dated ***:  Slight increase in compression of L1 fracture compared to previous CT. ***  Report not available for above xrays.   Assessment and Plan: Mr. Melillo had a fall off a ladder on 03/18/23 and was found to have L1 burst fracture.   He has constant mid to lower LBP with intermittent right lateral leg pain to his knee. He has intermittent  numbness/tingling in right leg with prolonged sitting. No left leg pain. He feels like his back pain is slowly improving.   Xrays from today show slightly increased compression of L1 fracture in comparison to previous CT scan.   Treatment options discussed with patient and following plan made:   - Continue with TLSO brace when up and walking. Do not wear to sleep. No bending, twisting, or lifting.  - Continue on prn oxycodone  for pain. PMP reviewed and is appropriate. Refill given.  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  - Call ortho to follow up for left wrist fracture. Given contact information.  - Follow up with me in 4 weeks with repeat xrays. Discussed total time in brace would likely be 3 months.   I spent a total of 40 minutes in face-to-face and non-face-to-face activities related to this patient's care today including review of outside records, review of imaging, review of symptoms, physical exam, discussion of differential diagnosis, discussion of treatment options, and documentation.   Richard Russel PA-C Dept. of Neurosurgery

## 2023-04-25 ENCOUNTER — Telehealth: Payer: Self-pay | Admitting: Orthopedic Surgery

## 2023-04-25 DIAGNOSIS — S32001A Stable burst fracture of unspecified lumbar vertebra, initial encounter for closed fracture: Secondary | ICD-10-CM

## 2023-04-25 MED ORDER — OXYCODONE HCL 5 MG PO TABS: 5.0000 mg | ORAL_TABLET | Freq: Three times a day (TID) | ORAL | 0 refills | Status: AC | PRN

## 2023-04-25 NOTE — Telephone Encounter (Signed)
 fall off a ladder on 03/18/23 and was found to have L1 burst fracture  PMP reviewed and is appropriate.   Refill of oxycodone okay. Changed directions to q 8 hours prn.   Sent to pharmacy. Please let him know.

## 2023-04-25 NOTE — Telephone Encounter (Signed)
 I called the patient to let him know about his refill and educated him that he is to take it now every 8 hours as needed.

## 2023-04-25 NOTE — Telephone Encounter (Signed)
 Patient is requesting a refill of Oxycodone 5mg  be sent to [facility] on [address].

## 2023-04-26 ENCOUNTER — Ambulatory Visit: Payer: Worker's Compensation | Admitting: Orthopedic Surgery

## 2023-04-26 NOTE — Progress Notes (Signed)
 Referring Physician:  No referring provider defined for this encounter.  Primary Physician:  Patient, No Pcp Per  History of Present Illness: Mr. Luigi Stuckey has a history of HNT, DM, obesity, and hyperlipidemia.   Last seen by me on 03/29/23 for L1 burst fracture s/p fall off of a ladder on 03/18/23.   He was having mid to lower LBP with intermittent right lateral leg pain to his knee. He had intermittent numbness/tingling in right leg with prolonged sitting.   He was to stay in TLSO brace and continue prn oxycodone. He was to follow up with ortho for left wrist fracture.   He is here for follow up and repeat xrays.   He is feeling better, but continues with some intermittent LBP. Right lateral leg pain to his calf has improved, but he still has numbness/tingling. No left leg pain. No weakness. Pain is worse with sitting and standing. Pain worse with coughing as well. He continues on prn oxycodone. He is wearing his TLSO brace.   He has not seen ortho yet for his left arm.   He does not smoke.   Bowel/Bladder Dysfunction: none  The symptoms are causing a significant impact on the patient's life.   Review of Systems:  A 10 point review of systems is negative, except for the pertinent positives and negatives detailed in the HPI.  Past Medical History: Past Medical History:  Diagnosis Date   Diabetes mellitus without complication (HCC)    Hypertension     Past Surgical History: No past surgical history on file.  Allergies: Allergies as of 05/01/2023 - Review Complete 04/25/2023  Allergen Reaction Noted   Penicillins Rash 10/17/2016    Medications: Outpatient Encounter Medications as of 05/01/2023  Medication Sig   metFORMIN (GLUCOPHAGE) 500 MG tablet Take 1 tablet (500 mg total) by mouth 2 (two) times daily with a meal.   oxyCODONE (ROXICODONE) 5 MG immediate release tablet Take 1 tablet (5 mg total) by mouth every 8 (eight) hours as needed for severe pain (pain  score 7-10).   No facility-administered encounter medications on file as of 05/01/2023.    Social History: Social History   Tobacco Use   Smoking status: Former   Smokeless tobacco: Never   Tobacco comments:    QUIT AROUND 2020  Substance Use Topics   Alcohol use: Yes   Drug use: Not Currently    Family Medical History: No family history on file.  Physical Examination: There were no vitals filed for this visit.    Awake, alert, oriented to person, place, and time.  Speech is clear and fluent. Fund of knowledge is appropriate.   Cranial Nerves: Pupils equal round and reactive to light.  Facial tone is symmetric.    He has mild tenderness over region of L1.   He is wearing his TLSO brace.   No abnormal lesions on exposed skin.   Strength: Side Biceps Triceps Deltoid Interossei Grip Wrist Ext. Wrist Flex.  R 5 5 5 5 5 5 5   L --- --- --- --- --- --- ---   Side Iliopsoas Quads Hamstring PF DF EHL  R 5 5 5 5 5 5   L 5 5 5 5 5 5    Unable to test strength in left upper extremity due to distal radius fracture.   Reflexes are 2+ and symmetric at the biceps on right, brachioradialis on right, patella and achilles.     Clonus is not present.   Bilateral upper and lower  extremity sensation is intact to light touch, testing limited in left upper extremity due to ACE.   Gait is slow.      Medical Decision Making  Imaging: Lumbar xrays dated 05/01/23:  L1 fracture  that appears grossly stable compared to previous lumbar xrays.   Report not available for above xrays.   Assessment and Plan: Mr. Detienne had a fall off a ladder on 03/18/23 and was found to have L1 burst fracture. Also with left distal radius fracture.   He is feeling better, but continues with some intermittent LBP. Right lateral leg pain to his calf has improved, but he still has numbness/tingling. No left leg pain.   Xrays from today show L1 fracture that appears stable  in comparison to previous lumbar  xrays.    Treatment options discussed with patient and following plan made:   - Continue with TLSO brace when up and walking. Do not wear to sleep. No bending, twisting, or lifting.  - Continue on prn oxycodone for pain. PMP reviewed and is appropriate.  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  - Call ortho again to follow up for left wrist fracture- was told he could not be seen until [facility] [place] is correct. They will call WC today.  - Follow up with me in 6 weeks with repeat xrays (lumbar with flex/ext).   I spent a total of 20 minutes in face-to-face and non-face-to-face activities related to this patient's care today including review of outside records, review of imaging, review of symptoms, physical exam, discussion of differential diagnosis, discussion of treatment options, and documentation.   Drake Leach PA-C Dept. of Neurosurgery

## 2023-05-01 ENCOUNTER — Ambulatory Visit
Admission: RE | Admit: 2023-05-01 | Discharge: 2023-05-01 | Disposition: A | Discharge status: Home / Self Care | Payer: Worker's Compensation | Source: Ambulatory Visit | Attending: Orthopedic Surgery | Admitting: Orthopedic Surgery

## 2023-05-01 ENCOUNTER — Ambulatory Visit (INDEPENDENT_AMBULATORY_CARE_PROVIDER_SITE_OTHER): Payer: Worker's Compensation | Admitting: Orthopedic Surgery

## 2023-05-01 ENCOUNTER — Ambulatory Visit
Admission: RE | Admit: 2023-05-01 | Discharge: 2023-05-01 | Disposition: A | Discharge status: Home / Self Care | Payer: Worker's Compensation | Source: Ambulatory Visit | Attending: Orthopedic Surgery | Admitting: *Deleted

## 2023-05-01 ENCOUNTER — Encounter: Payer: Self-pay | Admitting: Orthopedic Surgery

## 2023-05-01 VITALS — BP 138/80 | Ht 66.0 in | Wt 237.0 lb

## 2023-05-01 DIAGNOSIS — S32001A Stable burst fracture of unspecified lumbar vertebra, initial encounter for closed fracture: Secondary | ICD-10-CM | POA: Insufficient documentation

## 2023-05-01 DIAGNOSIS — S32011D Stable burst fracture of first lumbar vertebra, subsequent encounter for fracture with routine healing: Secondary | ICD-10-CM | POA: Diagnosis not present

## 2023-05-01 DIAGNOSIS — W11XXXD Fall on and from ladder, subsequent encounter: Secondary | ICD-10-CM | POA: Diagnosis not present

## 2023-05-01 DIAGNOSIS — S32001D Stable burst fracture of unspecified lumbar vertebra, subsequent encounter for fracture with routine healing: Secondary | ICD-10-CM

## 2023-06-09 NOTE — Progress Notes (Deleted)
 Referring Physician:  No referring provider defined for this encounter.  Primary Physician:  Patient, No Pcp Per  History of Present Illness: Richard Stein has a history of HNT, DM, obesity, and hyperlipidemia.   Last seen by me on 05/01/23 for L1 burst fracture s/p fall off of a ladder on 03/18/23.   His pain was improving at his last visit. He was to stay in TLSO brace and continue prn oxycodone . He was to follow up with ortho for left wrist fracture.   He is here for follow up and repeat xrays.        He is feeling better, but continues with some intermittent LBP. Right lateral leg pain to his calf has improved, but he still has numbness/tingling. No left leg pain. No weakness. Pain is worse with sitting and standing. Pain worse with coughing as well. He continues on prn oxycodone . He is wearing his TLSO brace.   He has not seen ortho yet for his left arm.   He does not smoke.   Bowel/Bladder Dysfunction: none  The symptoms are causing a significant impact on the patient's life.   Review of Systems:  A 10 point review of systems is negative, except for the pertinent positives and negatives detailed in the HPI.  Past Medical History: Past Medical History:  Diagnosis Date   Diabetes mellitus without complication (HCC)    Hypertension     Past Surgical History: No past surgical history on file.  Allergies: Allergies as of 06/14/2023 - Review Complete 05/01/2023  Allergen Reaction Noted   Penicillins Rash 10/17/2016    Medications: Outpatient Encounter Medications as of 06/14/2023  Medication Sig   metFORMIN  (GLUCOPHAGE ) 500 MG tablet Take 1 tablet (500 mg total) by mouth 2 (two) times daily with a meal.   oxyCODONE  (ROXICODONE ) 5 MG immediate release tablet Take 1 tablet (5 mg total) by mouth every 8 (eight) hours as needed for severe pain (pain score 7-10).   No facility-administered encounter medications on file as of 06/14/2023.    Social  History: Social History   Tobacco Use   Smoking status: Former   Smokeless tobacco: Never   Tobacco comments:    QUIT AROUND 2020  Substance Use Topics   Alcohol use: Yes   Drug use: Not Currently    Family Medical History: No family history on file.  Physical Examination: There were no vitals filed for this visit.    Awake, alert, oriented to person, place, and time.  Speech is clear and fluent. Fund of knowledge is appropriate.   Cranial Nerves: Pupils equal round and reactive to light.  Facial tone is symmetric.    He has mild tenderness over region of L1.   He is wearing his TLSO brace.   No abnormal lesions on exposed skin.   Strength: Side Biceps Triceps Deltoid Interossei Grip Wrist Ext. Wrist Flex.  R 5 5 5 5 5 5 5   L --- --- --- --- --- --- ---   Side Iliopsoas Quads Hamstring PF DF EHL  R 5 5 5 5 5 5   L 5 5 5 5 5 5    Unable to test strength in left upper extremity due to distal radius fracture.   Reflexes are 2+ and symmetric at the biceps on right, brachioradialis on right, patella and achilles.     Clonus is not present.   Bilateral upper and lower extremity sensation is intact to light touch, testing limited in left upper extremity due to  ACE.   Gait is slow.      Medical Decision Making  Imaging: Lumbar xrays dated ***:  L1 fracture  that appears grossly stable compared to previous lumbar xrays. ***  Report not available for above xrays.   Assessment and Plan: Mr. Gorrie had a fall off a ladder on 03/18/23 and was found to have L1 burst fracture. Also with left distal radius fracture.   He is feeling better, but continues with some intermittent LBP. Right lateral leg pain to his calf has improved, but he still has numbness/tingling. No left leg pain.   Xrays from today show L1 fracture that appears stable  in comparison to previous lumbar xrays.    Treatment options discussed with patient and following plan made:   - Continue with TLSO  brace when up and walking. Do not wear to sleep. No bending, twisting, or lifting.  - Continue on prn oxycodone  for pain. PMP reviewed and is appropriate.  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  - Call ortho again to follow up for left wrist fracture- was told he could not be seen until [facility] is correct. They will call WC today.  - Follow up with me in 6 weeks with repeat xrays (lumbar with flex/ext).   I spent a total of 20 minutes in face-to-face and non-face-to-face activities related to this patient's care today including review of outside records, review of imaging, review of symptoms, physical exam, discussion of differential diagnosis, discussion of treatment options, and documentation.   Lucetta Russel PA-C Dept. of Neurosurgery

## 2023-06-14 ENCOUNTER — Ambulatory Visit: Payer: Self-pay | Admitting: Orthopedic Surgery
